# Patient Record
Sex: Male | Born: 1975 | Hispanic: Yes | Marital: Single | Smoking: Light tobacco smoker
Health system: Southern US, Community
[De-identification: ages and names within clinical notes are randomized; demographics above are authoritative.]

## PROBLEM LIST (undated history)

## (undated) HISTORY — PX: HERNIA REPAIR: SHX51

## (undated) HISTORY — PX: HAND SURGERY: SHX662

---

## 2012-02-04 ENCOUNTER — Encounter (HOSPITAL_COMMUNITY): Payer: Self-pay | Admitting: *Deleted

## 2012-02-04 ENCOUNTER — Emergency Department (HOSPITAL_COMMUNITY)
Admission: EM | Admit: 2012-02-04 | Discharge: 2012-02-05 | Disposition: A | Discharge status: Home / Self Care | Payer: Self-pay | Attending: Emergency Medicine | Admitting: Emergency Medicine

## 2012-02-04 ENCOUNTER — Emergency Department (HOSPITAL_COMMUNITY): Payer: Self-pay

## 2012-02-04 DIAGNOSIS — R109 Unspecified abdominal pain: Secondary | ICD-10-CM | POA: Insufficient documentation

## 2012-02-04 DIAGNOSIS — R110 Nausea: Secondary | ICD-10-CM | POA: Insufficient documentation

## 2012-02-04 DIAGNOSIS — F17200 Nicotine dependence, unspecified, uncomplicated: Secondary | ICD-10-CM | POA: Insufficient documentation

## 2012-02-04 LAB — COMPREHENSIVE METABOLIC PANEL
AST: 34 U/L (ref 0–37)
Albumin: 4.6 g/dL (ref 3.5–5.2)
Alkaline Phosphatase: 76 U/L (ref 39–117)
BUN: 16 mg/dL (ref 6–23)
Chloride: 102 mEq/L (ref 96–112)
Potassium: 3.3 mEq/L — ABNORMAL LOW (ref 3.5–5.1)
Total Protein: 7.4 g/dL (ref 6.0–8.3)

## 2012-02-04 LAB — CBC WITH DIFFERENTIAL/PLATELET
Basophils Absolute: 0 10*3/uL (ref 0.0–0.1)
Basophils Relative: 0 % (ref 0–1)
Eosinophils Absolute: 0.1 10*3/uL (ref 0.0–0.7)
Hemoglobin: 15.2 g/dL (ref 13.0–17.0)
MCH: 30.8 pg (ref 26.0–34.0)
MCHC: 34.9 g/dL (ref 30.0–36.0)
Monocytes Relative: 7 % (ref 3–12)
Neutro Abs: 2.9 10*3/uL (ref 1.7–7.7)
Neutrophils Relative %: 46 % (ref 43–77)
Platelets: 190 10*3/uL (ref 150–400)
RDW: 12.1 % (ref 11.5–15.5)

## 2012-02-04 LAB — URINALYSIS, ROUTINE W REFLEX MICROSCOPIC
Bilirubin Urine: NEGATIVE
Ketones, ur: NEGATIVE mg/dL
Leukocytes, UA: NEGATIVE
Nitrite: NEGATIVE
Protein, ur: NEGATIVE mg/dL
Urobilinogen, UA: 0.2 mg/dL (ref 0.0–1.0)

## 2012-02-04 LAB — URINE MICROSCOPIC-ADD ON

## 2012-02-04 MED ORDER — FENTANYL CITRATE 0.05 MG/ML IJ SOLN
50.0000 ug | Freq: Once | INTRAMUSCULAR | Status: AC
  Administered 2012-02-04: 50 ug via INTRAVENOUS
  Filled 2012-02-04: qty 2

## 2012-02-04 MED ORDER — SODIUM CHLORIDE 0.9 % IV SOLN
1000.0000 mL | Freq: Once | INTRAVENOUS | Status: AC
  Administered 2012-02-04: 1000 mL via INTRAVENOUS

## 2012-02-04 MED ORDER — KETOROLAC TROMETHAMINE 30 MG/ML IJ SOLN
30.0000 mg | Freq: Once | INTRAMUSCULAR | Status: AC
  Administered 2012-02-04: 30 mg via INTRAVENOUS
  Filled 2012-02-04: qty 1

## 2012-02-04 MED ORDER — ONDANSETRON HCL 4 MG/2ML IJ SOLN
4.0000 mg | Freq: Once | INTRAMUSCULAR | Status: AC
  Administered 2012-02-04: 4 mg via INTRAVENOUS
  Filled 2012-02-04: qty 2

## 2012-02-04 NOTE — ED Notes (Signed)
Pt sts he has had left flank pain x 3 months that has recently worsened since 3 days ago. Pt sts he has vomitted approximately 10 times today. Sts no problems with diarrhea or hematuria. Guarding abdomen and left flank.

## 2012-02-04 NOTE — ED Notes (Signed)
Pt states left flank pain and nausea for 3 months,  And radiates to abdominal cavity

## 2012-02-04 NOTE — ED Provider Notes (Signed)
History     CSN: [id number]  Arrival date & time 02/04/12  2153   First MD Initiated Contact with Patient 02/04/12 2238      Chief Complaint  Patient presents with  . Flank Pain  . Nausea    (Consider location/radiation/quality/duration/timing/severity/associated sxs/prior treatment) HPI Comments: Dominic Curry is a 36 y.o. Male who presents with left flank pain, radiating into left lower abdomen. States pain for about 3 months, intermittent, but worsened since yesterday. denies urinary problems. NO hematuria. States nausea, vomiting, dizzy. States last bowel movement today, normal, but painful. No blood per rectum. No hx of abdominal problems in the past, other than hernias which were surgically repaired. States does have a hx of a kidney stone.    History reviewed. No pertinent past medical history.  Past Surgical History  Procedure Date  . Hernia repair   . Hand surgery     History reviewed. No pertinent family history.  History  Substance Use Topics  . Smoking status: Light Tobacco Smoker  . Smokeless tobacco: Not on file  . Alcohol Use: 1.2 oz/week    2 Cans of beer per week      Review of Systems  Constitutional: Negative for fever and chills.  HENT: Negative for neck pain and neck stiffness.   Respiratory: Negative.   Cardiovascular: Negative.   Gastrointestinal: Positive for abdominal pain. Negative for nausea and vomiting.  Genitourinary: Positive for flank pain. Negative for dysuria, urgency, hematuria, penile swelling, scrotal swelling and penile pain.  Skin: Negative.   Neurological: Negative for dizziness, weakness and numbness.    Allergies  Review of patient's allergies indicates no known allergies.  Home Medications  No current outpatient prescriptions on file.  BP 148/80  Pulse 84  Temp 98.1 F (36.7 C) (Oral)  Resp 20  Ht 5\' 9"  (1.753 m)  Wt 165 lb (74.844 kg)  BMI 24.37 kg/m2  SpO2 100%  Physical Exam  Vitals  reviewed. Constitutional: He appears well-developed and well-nourished. No distress.  Eyes: Conjunctivae normal are normal.  Neck: Neck supple.  Cardiovascular: Normal rate, regular rhythm and normal heart sounds.   Pulmonary/Chest: Effort normal and breath sounds normal. No respiratory distress. He has no wheezes. He has no rales.  Abdominal: Soft. Bowel sounds are normal. There is tenderness. There is no rebound.       LLQ tenderness, no guarding. Left CVA tenderness  Neurological: He is alert.  Skin: Skin is warm.  Psychiatric: He has a normal mood and affect.    ED Course  Procedures (including critical care time)  Pt with left flank pain. Hx of kidney stones, he is nauseated, vomiting, appears uncofortable.  Given pt is relatively young, 36yo, with no medical problems, his exam and symptoms suspicious for a kidney stone, labs and UA peding  Results for orders placed during the hospital encounter of 02/04/12  URINALYSIS, ROUTINE W REFLEX MICROSCOPIC      Component Value Range   Color, Urine YELLOW  YELLOW   APPearance CLEAR  CLEAR   Specific Gravity, Urine 1.013  1.005 - 1.030   pH 6.5  5.0 - 8.0   Glucose, UA NEGATIVE  NEGATIVE mg/dL   Hgb urine dipstick TRACE (*) NEGATIVE   Bilirubin Urine NEGATIVE  NEGATIVE   Ketones, ur NEGATIVE  NEGATIVE mg/dL   Protein, ur NEGATIVE  NEGATIVE mg/dL   Urobilinogen, UA 0.2  0.0 - 1.0 mg/dL   Nitrite NEGATIVE  NEGATIVE   Leukocytes, UA NEGATIVE  NEGATIVE  CBC WITH DIFFERENTIAL      Component Value Range   WBC 6.2  4.0 - 10.5 K/uL   RBC 4.93  4.22 - 5.81 MIL/uL   Hemoglobin 15.2  13.0 - 17.0 g/dL   HCT 52.8  41.3 - 24.4 %   MCV 88.4  78.0 - 100.0 fL   MCH 30.8  26.0 - 34.0 pg   MCHC 34.9  30.0 - 36.0 g/dL   RDW 01.0  27.2 - 53.6 %   Platelets 190  150 - 400 K/uL   Neutrophils Relative 46  43 - 77 %   Neutro Abs 2.9  1.7 - 7.7 K/uL   Lymphocytes Relative 45  12 - 46 %   Lymphs Abs 2.8  0.7 - 4.0 K/uL   Monocytes Relative 7  3 - 12  %   Monocytes Absolute 0.4  0.1 - 1.0 K/uL   Eosinophils Relative 2  0 - 5 %   Eosinophils Absolute 0.1  0.0 - 0.7 K/uL   Basophils Relative 0  0 - 1 %   Basophils Absolute 0.0  0.0 - 0.1 K/uL  COMPREHENSIVE METABOLIC PANEL      Component Value Range   Sodium 138  135 - 145 mEq/L   Potassium 3.3 (*) 3.5 - 5.1 mEq/L   Chloride 102  96 - 112 mEq/L   CO2 25  19 - 32 mEq/L   Glucose, Bld 92  70 - 99 mg/dL   BUN 16  6 - 23 mg/dL   Creatinine, Ser 6.44  0.50 - 1.35 mg/dL   Calcium 9.2  8.4 - 03.4 mg/dL   Total Protein 7.4  6.0 - 8.3 g/dL   Albumin 4.6  3.5 - 5.2 g/dL   AST 34  0 - 37 U/L   ALT 51  0 - 53 U/L   Alkaline Phosphatase 76  39 - 117 U/L   Total Bilirubin 0.4  0.3 - 1.2 mg/dL   GFR calc non Af Amer >90  >90 mL/min   GFR calc Af Amer >90  >90 mL/min  URINE MICROSCOPIC-ADD ON      Component Value Range   Squamous Epithelial / LPF RARE  RARE    No elevated WBC. Normal labs. Urine with trace Hgb. Will get ct scan to r/o kidney stone     Ct Abdomen Pelvis Wo Contrast  02/05/2012  *RADIOLOGY REPORT*  Clinical Data: Left flank pain, worsening recently.  Vomiting.  CT ABDOMEN AND PELVIS WITHOUT CONTRAST  Technique:  Multidetector CT imaging of the abdomen and pelvis was performed following the standard protocol without intravenous contrast.  Comparison: None.  Findings: The visualized portion of the liver, spleen, pancreas, and adrenal glands appear unremarkable in noncontrast CT appearance.  The gallbladder and biliary system appear unremarkable.  The kidneys appear unremarkable, as do the proximal ureters.  No dilated bowel noted.  Appendix normal.  Urinary bladder unremarkable.  IMPRESSION:  1.  No specific abnormality to account the patient's left flank pain is observed on today's noncontrast examination.   Original Report Authenticated By: Dellia Cloud, M.D.     12:47 AM Pt reassessed. CT negative. His pain has resolved. Given pt's symptoms and history, as well as PE  findings, still highly suspicious for a kidney stone. Pt is pain free, it is possible that stone has passed. Abdomen reassessed, non tender. No WBC, doubt colitis or diverticulitis or any surgical abdomen. Pt instructed to return if worsening.    1. Lt  flank pain   2. Abdominal pain       MDM  See above        Lottie Mussel, PA 02/05/12 0050

## 2012-02-05 MED ORDER — IBUPROFEN 600 MG PO TABS: 600.0000 mg | ORAL_TABLET | Freq: Four times a day (QID) | ORAL | Status: AC | PRN

## 2012-02-05 MED ORDER — HYDROCODONE-ACETAMINOPHEN 5-500 MG PO TABS: 1.0000 | ORAL_TABLET | Freq: Four times a day (QID) | ORAL | Status: DC | PRN

## 2012-02-05 NOTE — ED Provider Notes (Signed)
Medical screening examination/treatment/procedure(s) were performed by non-physician practitioner and as supervising physician I was immediately available for consultation/collaboration.   Jacklyn Branan, MD 02/05/12 0112 

## 2013-10-18 IMAGING — CT CT ABD-PELV W/O CM
1 series · 15 of 20 positions shown, 20 images · non-contrast
Comparison: None.

CLINICAL DATA: Left flank pain, worsening recently.  Vomiting.

CT ABDOMEN AND PELVIS WITHOUT CONTRAST
TECHNIQUE: Multidetector CT imaging of the abdomen and pelvis was
performed following the standard protocol without intravenous
contrast.

[Series 6: lung · axial · 0.73mm/px · z∈[-174,-89]mm · 15 of 20 slices shown, 20 images]
[im 2/20  soft-tissue]
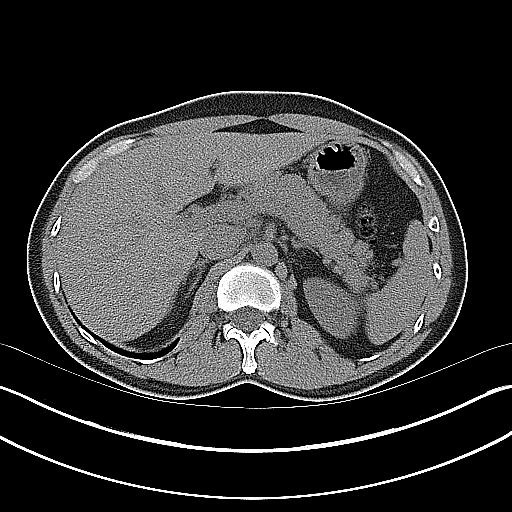
[im 2/20  bone]
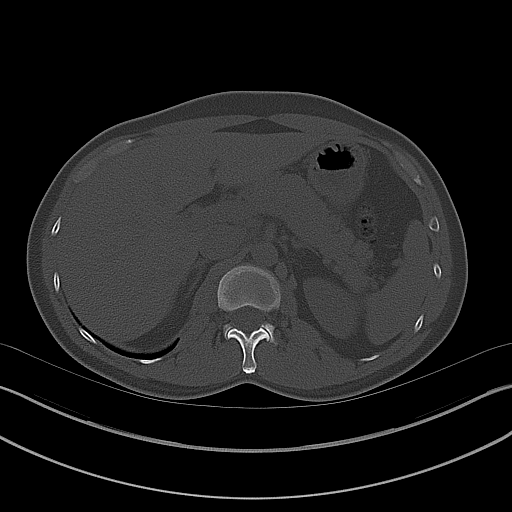
[im 3/20  soft-tissue]
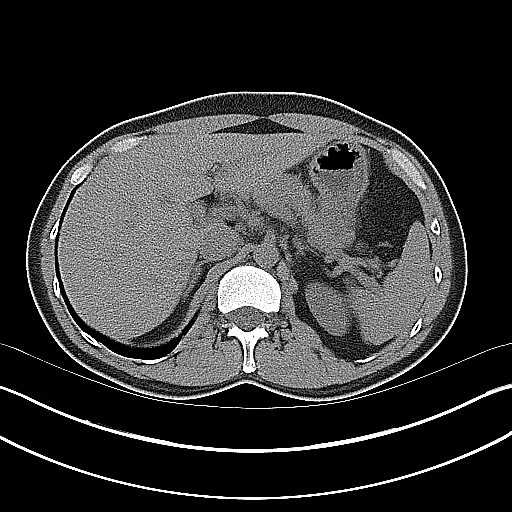
[im 5/20  soft-tissue]
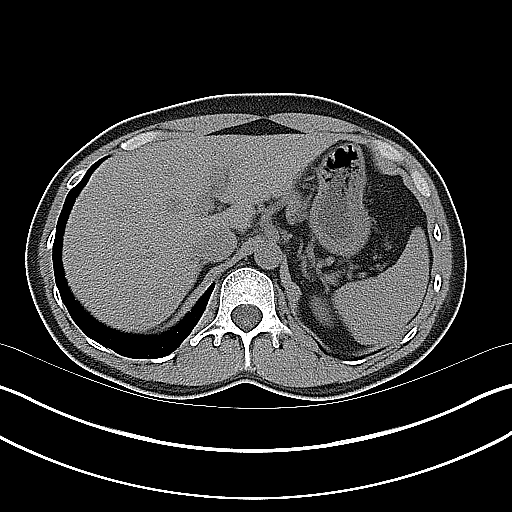
[im 6/20  soft-tissue]
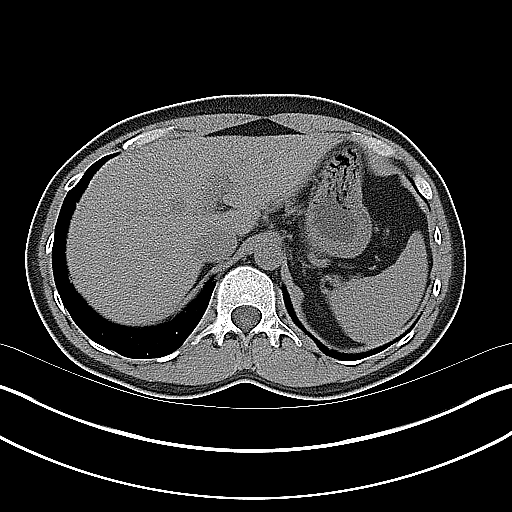
[im 7/20  soft-tissue]
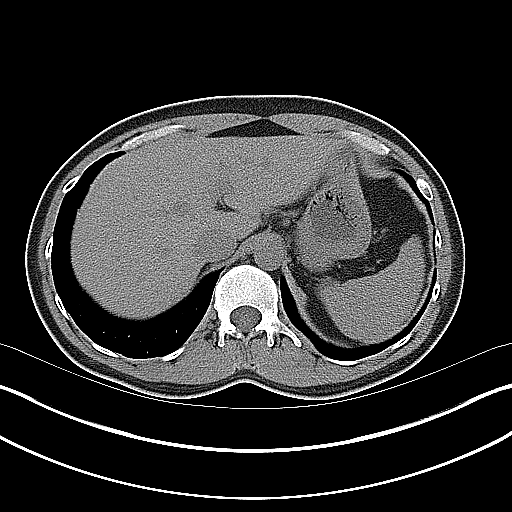
[im 9/20  soft-tissue]
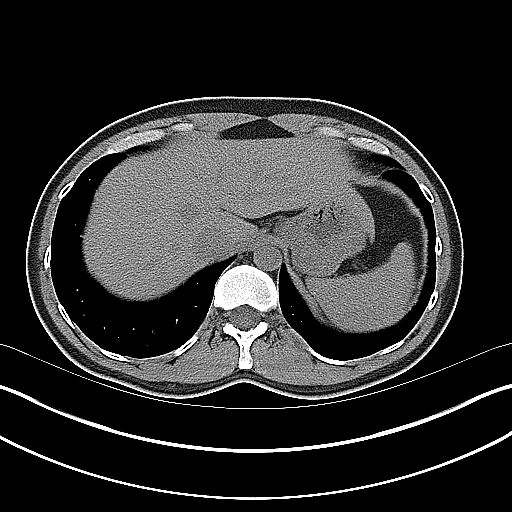
[im 10/20  soft-tissue]
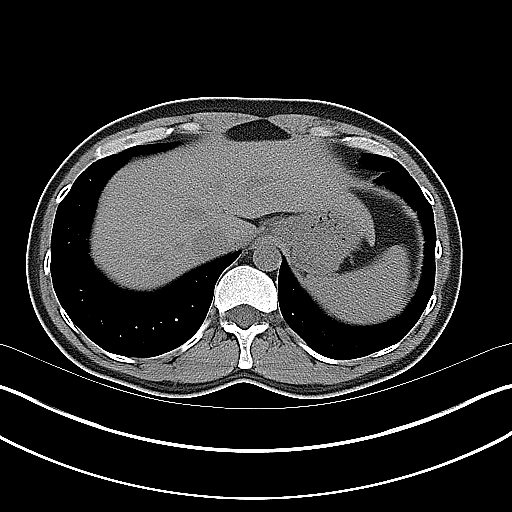
[im 11/20  soft-tissue]
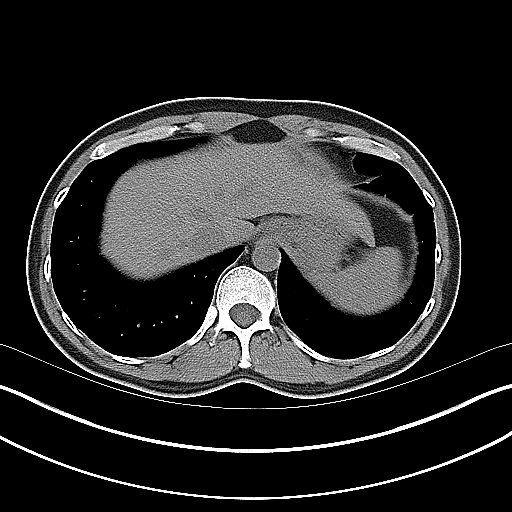
[im 12/20  soft-tissue]
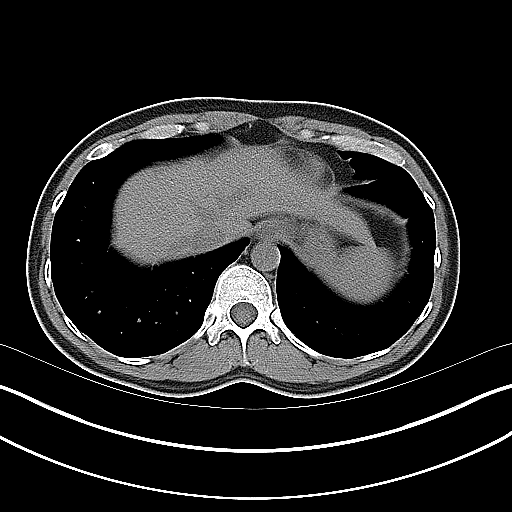
[im 12/20  bone]
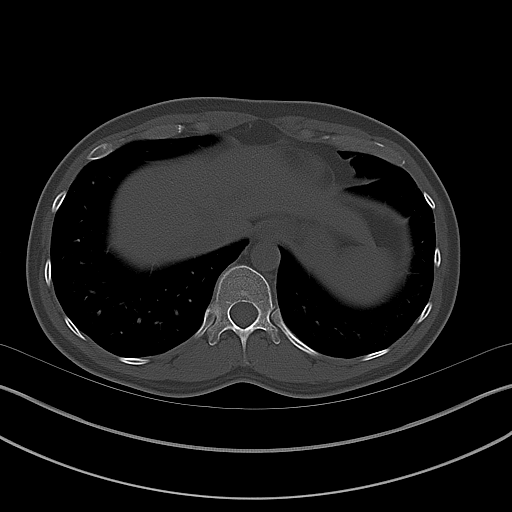
[im 14/20  soft-tissue]
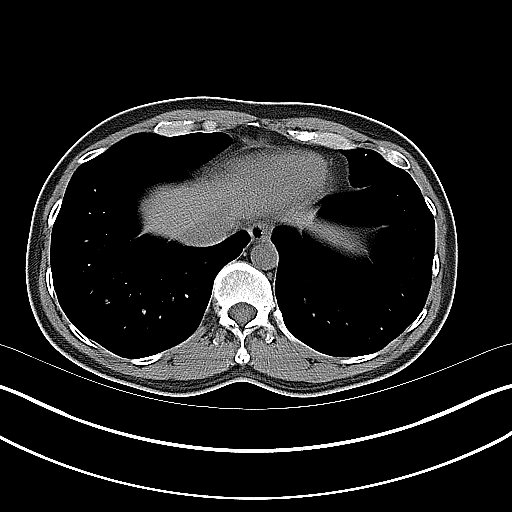
[im 15/20  soft-tissue]
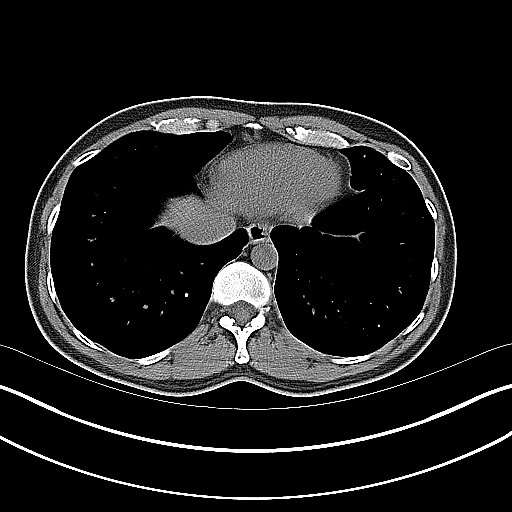
[im 16/20  soft-tissue]
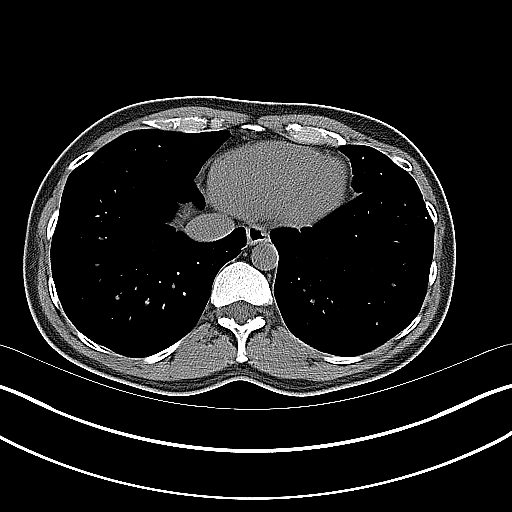
[im 16/20  lung]
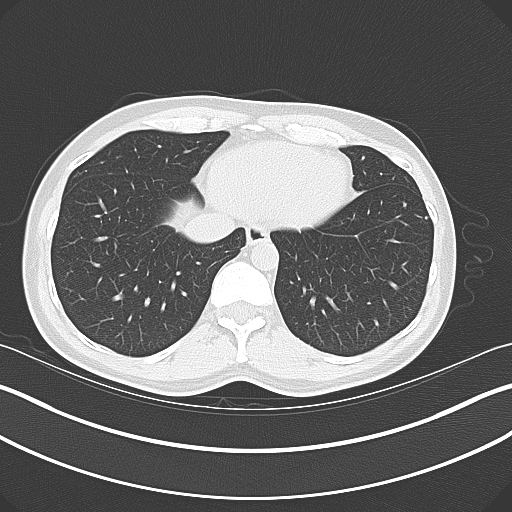
[im 17/20  lung]
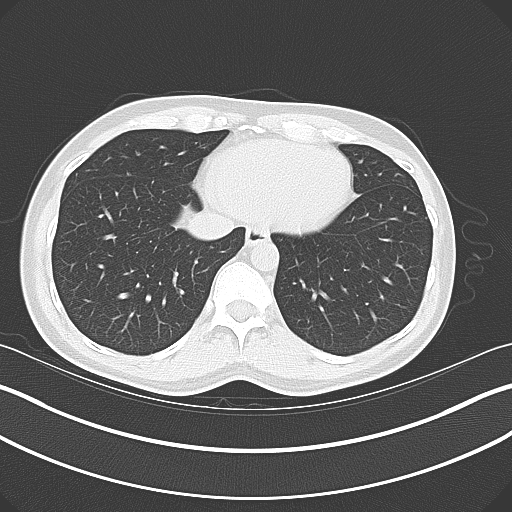
[im 18/20  soft-tissue]
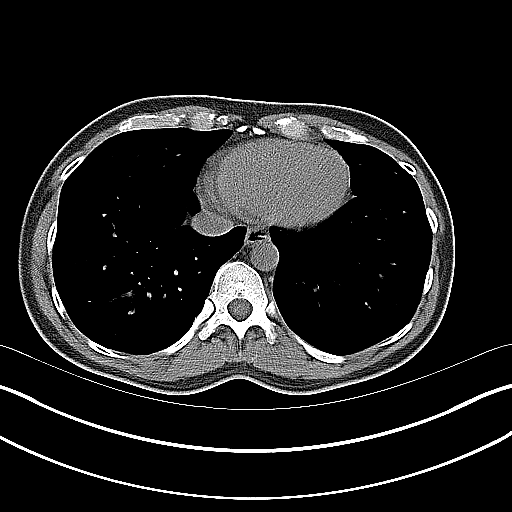
[im 18/20  lung]
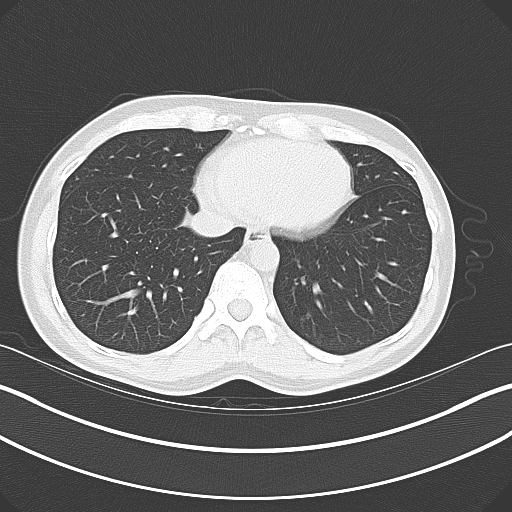
[im 19/20  soft-tissue]
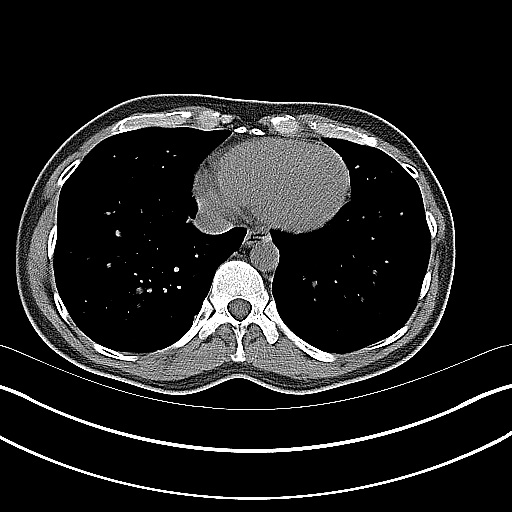
[im 19/20  lung]
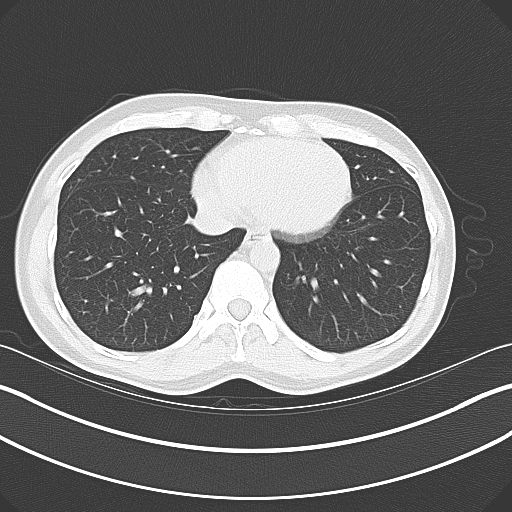

[15 of 20 positions shown; findings below may reference images not displayed]

FINDINGS: The visualized portion of the liver, spleen, pancreas,
and adrenal glands appear unremarkable in noncontrast CT
appearance.

The gallbladder and biliary system appear unremarkable.

The kidneys appear unremarkable, as do the proximal ureters.

No dilated bowel noted.  Appendix normal.  Urinary bladder
unremarkable.
IMPRESSION: 1.  No specific abnormality to account the patient's left flank
pain is observed on today's noncontrast examination.

## 2013-12-26 ENCOUNTER — Ambulatory Visit (INDEPENDENT_AMBULATORY_CARE_PROVIDER_SITE_OTHER): Payer: Self-pay | Admitting: Family Medicine

## 2013-12-26 VITALS — BP 110/80 | HR 53 | Temp 98.1°F | Resp 16 | Ht 70.0 in | Wt 144.0 lb

## 2013-12-26 DIAGNOSIS — Z113 Encounter for screening for infections with a predominantly sexual mode of transmission: Secondary | ICD-10-CM

## 2013-12-26 DIAGNOSIS — B354 Tinea corporis: Secondary | ICD-10-CM

## 2013-12-26 LAB — HIV ANTIBODY (ROUTINE TESTING W REFLEX): HIV 1&2 Ab, 4th Generation: NONREACTIVE

## 2013-12-26 LAB — RPR

## 2013-12-26 LAB — HEPATITIS C ANTIBODY: HCV Ab: NEGATIVE

## 2013-12-26 LAB — HEPATITIS B SURFACE ANTIGEN: HEP B S AG: NEGATIVE

## 2013-12-26 MED ORDER — TERBINAFINE HCL 250 MG PO TABS: 250.0000 mg | ORAL_TABLET | Freq: Every day | ORAL | Status: AC

## 2013-12-26 NOTE — Progress Notes (Signed)
[facility] [address] [place] [address] [phone]  Date:  12/26/2013   Name:  Dominic Curry   DOB:  1975-12-01   MRN:  [id number]  PCP:  No primary provider on file.    Chief Complaint: STD Testing and Rash   History of Present Illness:  Dominic Curry is a 38 y.o. very pleasant male patient who presents with the following:  He has noted an itchy rash on his face- always on the right- for about 3 years.  It will come and go.  Nowhere else on his body.   He would like to have STD testing today as well.   He does not have any symptoms- just wants screening.   He is generally in good health.   There are no active problems to display for this patient.   History reviewed. No pertinent past medical history.  Past Surgical History  Procedure Laterality Date  . Hernia repair    . Hand surgery      History  Substance Use Topics  . Smoking status: Light Tobacco Smoker  . Smokeless tobacco: Not on file  . Alcohol Use: 1.2 oz/week    2 Cans of beer per week    No family history on file.  No Known Allergies  Medication list has been reviewed and updated.  Current Outpatient Prescriptions on File Prior to Visit  Medication Sig Dispense Refill  . ibuprofen (ADVIL,MOTRIN) 600 MG tablet Take 1 tablet (600 mg total) by mouth every 6 (six) hours as needed for pain.  30 tablet  0  . HYDROcodone-acetaminophen (VICODIN) 5-500 MG per tablet Take 1-2 tablets by mouth every 6 (six) hours as needed for pain.  15 tablet  0   No current facility-administered medications on file prior to visit.    Review of Systems:  As per HPI- otherwise negative.   Physical Examination: Filed Vitals:   12/26/13 0957  BP: 110/80  Pulse: 53  Temp: 98.1 F (36.7 C)  Resp: 16   Filed Vitals:   12/26/13 0957  Height:  (1.778 m)  Weight: 144 lb (65.318 kg)   Body mass index is 20.66 kg/(m^2). Ideal Body Weight: Weight in (lb) to have BMI = 25:  173.9  GEN: WDWN, NAD, Non-toxic, A & O x 3, looks well HEENT: Atraumatic, Normocephalic. Neck supple. No masses, No LAD.  Mild scaly patches on his face on the cheeks and beard area.    Bilateral TM wnl, oropharynx normal.  PEERL,EOMI.   Ears and Nose: No external deformity. CV: RRR, No M/G/R. No JVD. No thrill. No extra heart sounds. PULM: CTA B, no wheezes, crackles, rhonchi. No retractions. No resp. distress. No accessory muscle use. EXTR: No c/c/e NEURO Normal gait.  PSYCH: Normally interactive. Conversant. Not depressed or anxious appearing.  Calm demeanor.    Assessment and Plan: Tinea corporis - Plan: terbinafine (LAMISIL) 250 MG tablet  Screening for STD (sexually transmitted disease) - Plan: GC/Chlamydia Probe Amp, HIV antibody, RPR, Hepatitis C antibody, Hepatitis B surface antigen  Will try 2 weeks of lamisil for his likely tinea corporis/ tinea barbae.  STD screening- will contact him with results   Signed Abbe Amsterdam, MD

## 2013-12-26 NOTE — Patient Instructions (Signed)
I will be in touch with your labs.  Use the lamisil once a day for 2 weeks- let me know if this does not help

## 2013-12-27 ENCOUNTER — Encounter: Payer: Self-pay | Admitting: Family Medicine

## 2013-12-27 LAB — GC/CHLAMYDIA PROBE AMP
CT Probe RNA: NEGATIVE
GC PROBE AMP APTIMA: NEGATIVE
